# Patient Record
Sex: Male | Born: 1970 | Race: White | Hispanic: No | Marital: Married | Smoking: Current some day smoker
Health system: Southern US, Community
[De-identification: ages and names within clinical notes are randomized; demographics above are authoritative.]

## PROBLEM LIST (undated history)

## (undated) DIAGNOSIS — I10 Essential (primary) hypertension: Secondary | ICD-10-CM

---

## 2015-02-08 ENCOUNTER — Encounter (HOSPITAL_COMMUNITY): Payer: Self-pay | Admitting: Emergency Medicine

## 2015-02-08 ENCOUNTER — Emergency Department (HOSPITAL_COMMUNITY)

## 2015-02-08 ENCOUNTER — Emergency Department (HOSPITAL_COMMUNITY)
Admission: EM | Admit: 2015-02-08 | Discharge: 2015-02-08 | Disposition: A | Discharge status: Home / Self Care | Attending: Emergency Medicine | Admitting: Emergency Medicine

## 2015-02-08 DIAGNOSIS — Y9289 Other specified places as the place of occurrence of the external cause: Secondary | ICD-10-CM | POA: Insufficient documentation

## 2015-02-08 DIAGNOSIS — Y9389 Activity, other specified: Secondary | ICD-10-CM | POA: Insufficient documentation

## 2015-02-08 DIAGNOSIS — Y998 Other external cause status: Secondary | ICD-10-CM | POA: Diagnosis not present

## 2015-02-08 DIAGNOSIS — S61012A Laceration without foreign body of left thumb without damage to nail, initial encounter: Secondary | ICD-10-CM | POA: Insufficient documentation

## 2015-02-08 DIAGNOSIS — Y288XXA Contact with other sharp object, undetermined intent, initial encounter: Secondary | ICD-10-CM | POA: Insufficient documentation

## 2015-02-08 DIAGNOSIS — Z23 Encounter for immunization: Secondary | ICD-10-CM | POA: Diagnosis not present

## 2015-02-08 DIAGNOSIS — S6992XA Unspecified injury of left wrist, hand and finger(s), initial encounter: Secondary | ICD-10-CM | POA: Diagnosis present

## 2015-02-08 DIAGNOSIS — S61219A Laceration without foreign body of unspecified finger without damage to nail, initial encounter: Secondary | ICD-10-CM

## 2015-02-08 HISTORY — DX: Essential (primary) hypertension: I10

## 2015-02-08 MED ORDER — TETANUS-DIPHTH-ACELL PERTUSSIS 5-2.5-18.5 LF-MCG/0.5 IM SUSP
0.5000 mL | Freq: Once | INTRAMUSCULAR | Status: AC
  Administered 2015-02-08: 0.5 mL via INTRAMUSCULAR
  Filled 2015-02-08: qty 0.5

## 2015-02-08 MED ORDER — GELATIN ABSORBABLE 12-7 MM EX MISC
1.0000 | Freq: Once | CUTANEOUS | Status: AC
  Administered 2015-02-08: 1 via TOPICAL
  Filled 2015-02-08 (×2): qty 1

## 2015-02-08 MED ORDER — OXYCODONE-ACETAMINOPHEN 5-325 MG PO TABS
2.0000 | ORAL_TABLET | Freq: Once | ORAL | Status: AC
  Administered 2015-02-08: 2 via ORAL
  Filled 2015-02-08: qty 2

## 2015-02-08 MED ORDER — CEPHALEXIN 500 MG PO CAPS: 500.0000 mg | ORAL_CAPSULE | Freq: Four times a day (QID) | ORAL | Status: AC

## 2015-02-08 MED ORDER — HYDROCODONE-ACETAMINOPHEN 5-325 MG PO TABS: 2.0000 | ORAL_TABLET | ORAL | Status: AC | PRN

## 2015-02-08 NOTE — ED Notes (Signed)
Pt accidentally cut off the end of his thumb and his thumb nail with box cutters. Not up to date with tetanus. Moderate-heavy bleeding.

## 2015-02-08 NOTE — ED Notes (Addendum)
Gelfoam and finger splint applied. Pt instructed to call pcp and get appt with hand surgeon in [place], where he is from. Pt told for s/s to watch for and what the hand surgeon needs to be checking for. Pt instructed that if he is unable to get an app[facility] with [facility], then to follow up with Dr Izora Ribasoley in [place].   Pt escorted to discharge window. Pt verbalized understanding discharge instructions. In no acute distress.

## 2015-02-08 NOTE — Discharge Instructions (Signed)
Traumatic Finger Amputation °A traumatic finger amputation is when you lose part or all of a finger because of an accident or injury. The severity of this type of injury can vary widely. It can mean that just the tip of your finger gets ripped off (avulsion), or it can mean that you completely lose a finger (amputation). °Traumatic finger amputation is a medical emergency. It requires immediate care to prevent further damage and to save the finger. °CAUSES °A traumatic finger amputation usually results from an accident that involves: °· A car. °· Power tools. °· Factory work. °· Farm or lawn equipment. °SYMPTOMS  °Symptoms of a traumatic finger amputation include: °· Bleeding. °· Pain. °· Damage to surrounding tissues, such as bones, muscles, tendons, and skin. °Severe injuries can sometimes lead to shock, which is a life-threatening condition in which your body fails to get blood, oxygen, and nutrients to vital organs. Some common symptoms of shock include low blood pressure, sweating, weakness, and pale skin. °DIAGNOSIS °Your health care provider will do a physical exam and ask for details about how your injury occurred. The physical exam will help your health care provider to determine the severity of the injury and the best way to repair it. X-rays may be done to check for damage to the surrounding bones and tissues. °TREATMENT °Treatment depends on the type of injury that you have and how bad it is. °· Your health care provider will clean the wound thoroughly and apply a medicine (anesthetic) to relieve pain. °· If just the tip of your finger was removed, the wound will typically heal on its own with a protective bandage (dressing) and regular cleaning. °· For more severe injuries, a portion of skin may need to be taken from another part of the body (graft) and attached to the wound site until the wound heals. °· If a large portion of the finger was amputated, it may be possible to reattach it surgically  (replantation). °HOME CARE INSTRUCTIONS °· Take medicines only as directed by your health care provider. °· If you were prescribed an antibiotic medicine, finish all of it even if you start to feel better. °· There are many different ways to close and cover a wound, including stitches (sutures), skin glue, and adhesive strips. Follow your health care provider's instructions about: °¨ Wound care. °¨ Dressing changes and removal. °¨ Wound closure removal. °· Check your wound every day for signs of infection. Watch for: °¨ Redness, swelling, or pain. °¨ Fluid, blood, or pus. °· Do exercises to strengthen your finger and hand as directed by your health care provider. °SEEK MEDICAL CARE IF: °· Your wound does not seem to be healing well. °· You have redness, swelling, or pain at your wound site. °· You have fluid, blood, or pus coming from your wound. °· You have a fever. °  °This information is not intended to replace advice given to you by your health care provider. Make sure you discuss any questions you have with your health care provider. °  °Document Released: 06/14/2001 Document Revised: 04/26/2014 Document Reviewed: 12/19/2013 °Elsevier Interactive Patient Education ©2016 [employer] ° °

## 2015-02-08 NOTE — ED Notes (Signed)
Pt alert and oriented x4. Respirations even and unlabored, bilateral symmetrical rise and fall of chest. Skin warm and dry. In no acute distress. Denies needs.   

## 2015-02-08 NOTE — ED Notes (Signed)
Ortho called 

## 2015-02-08 NOTE — ED Provider Notes (Signed)
CSN: [id number]     Arrival date & time 02/08/15  1332 History  By signing my name below, I, Placido Sou, attest that this documentation has been prepared under the direction and in the presence of Cheron Schaumann, [place]. Electronically Signed: Placido Sou, ED Scribe. 02/08/2015. 1:46 PM.  Pt cut his finger with a box cutter.  Pt complains of cutting off the tip of his left thumb.  Chief Complaint  Patient presents with  . Finger Injury   Patient is a 44 y.o. male presenting with hand pain. The history is provided by the patient. No language interpreter was used.  Hand Pain This is a new problem. The current episode started today. The problem occurs constantly. The problem has been unchanged. Pertinent negatives include no numbness. Nothing aggravates the symptoms. He has tried nothing for the symptoms. The treatment provided no relief.    HPI Comments: Wyatt Mills is a 44 y.o. male who presents to the Emergency Department complaining of finger laceration   No past medical history on file. No past surgical history on file. No family history on file. Social History  Substance Use Topics  . Smoking status: Not on file  . Smokeless tobacco: Not on file  . Alcohol Use: Not on file    Review of Systems  Neurological: Negative for numbness.  All other systems reviewed and are negative.   Allergies  Review of patient's allergies indicates not on file.  Home Medications   Prior to Admission medications   Not on File   BP 138/94 mmHg  Pulse 92  Temp(Src) 98 F (36.7 C) (Oral)  Resp 17  SpO2 94% Physical Exam  Constitutional: He is oriented to person, place, and time. He appears well-developed and well-nourished.  HENT:  Head: Normocephalic and atraumatic.  Mouth/Throat: No oropharyngeal exudate.  Neck: Normal range of motion. No tracheal deviation present.  Cardiovascular: Normal rate.   Pulmonary/Chest: Effort normal. No respiratory distress.  Abdominal: Soft. There  is no tenderness.  Musculoskeletal: Normal range of motion.  Tip of finger avulsed, (pt has superficial piece of skin) nv and ns intact  Neurological: He is alert and oriented to person, place, and time.  Skin: Skin is warm and dry. He is not diaphoretic.  Psychiatric: He has a normal mood and affect. His behavior is normal.  Nursing note and vitals reviewed.   ED Course  Procedures  DIAGNOSTIC STUDIES: Oxygen Saturation is 94% on RA, adequate by my interpretation.    COORDINATION OF CARE: 1:46 PM Pt presents today due to finger laceration. Discussed treatment plan with pt at bedside . Return precautions noted. Referral to  provided for use as needed. Pt agreed to plan.  Labs Review Labs Reviewed - No data to display  Imaging Review Dg Finger Thumb Left  02/08/2015  CLINICAL DATA:  Accidental left thumb laceration with box cutters. EXAM: LEFT THUMB 2+V COMPARISON:  None. FINDINGS: There is no evidence of fracture or dislocation. There is no evidence of arthropathy or other focal bone abnormality. Irregularity is seen involving the distal soft tissues consistent with laceration or soft tissue amputation. IMPRESSION: No fracture or dislocation is noted. No radiopaque foreign body is noted. Electronically Signed   By: Lupita Raider, M.D.   On: 02/08/2015 14:55   I have personally reviewed and evaluated these images and lab results as part of my medical decision-making.   EKG Interpretation None      MDM gelfoam  Dressing, splint    Final  diagnoses:  Laceration of finger, initial encounter    I spoke to Dr. Izora Ribascoley who advised wound care, keflex and office follow up.   [place] in [place] probably [place] there.  He will see Dr. Izora Ribascoley if he is uanable to [place] hand.   Lonia SkinnerLeslie K BellemeadeSofia, PA-C 02/08/15 2010  Richardean Canalavid H Yao, MD 02/09/15 [id number]

## 2016-04-08 IMAGING — CR DG FINGER THUMB 2+V*L*
3 series · 3 of 3 positions shown · non-contrast
Comparison: None.

CLINICAL DATA: Accidental left thumb laceration with box cutters.

EXAM:
LEFT THUMB 2+V

[x finger obl left]
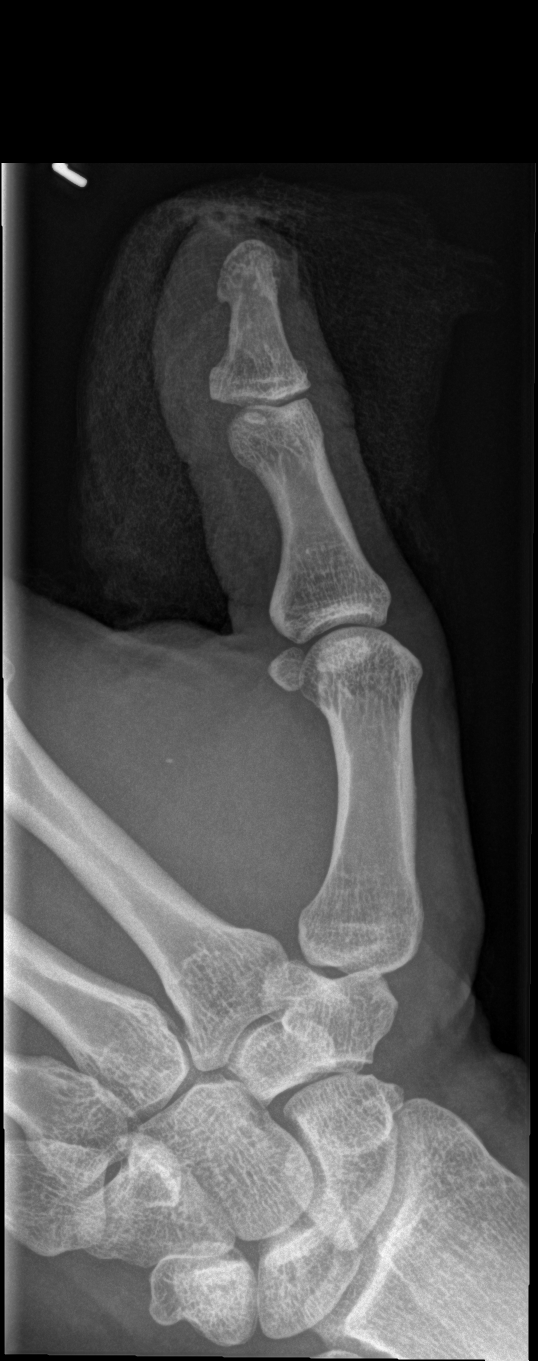

[x finger lat left]
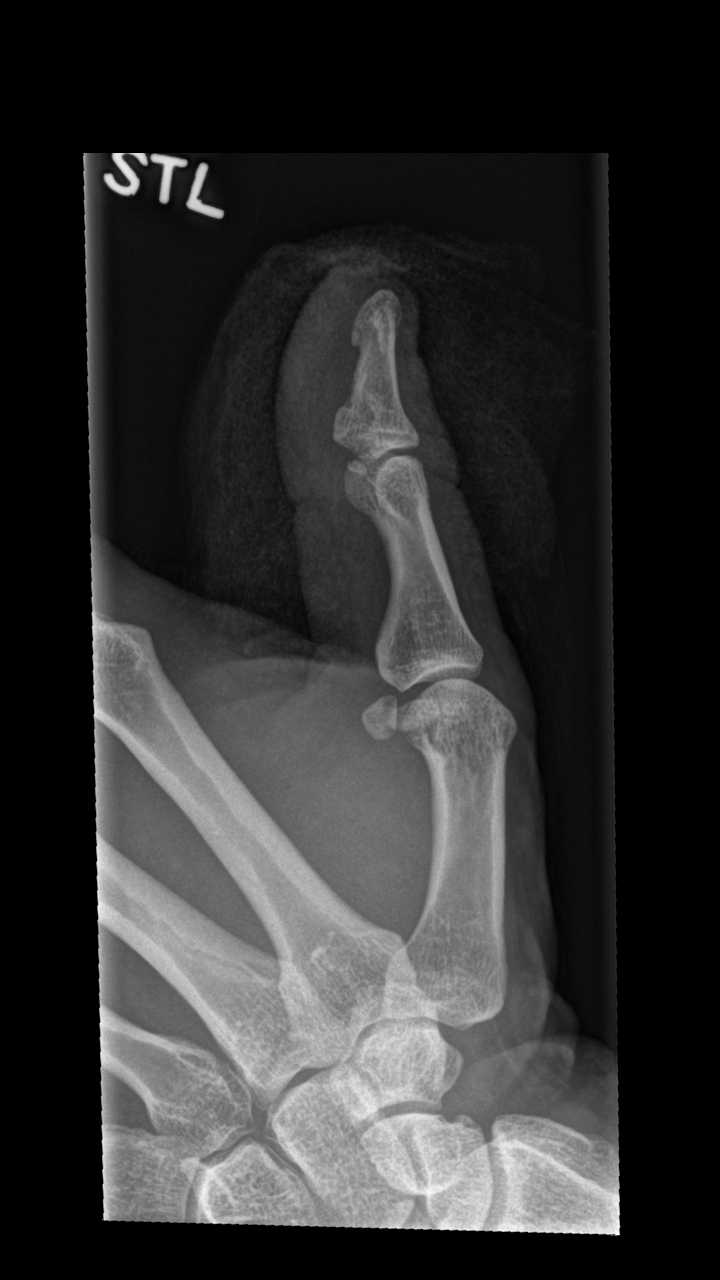

[x finger pa left]
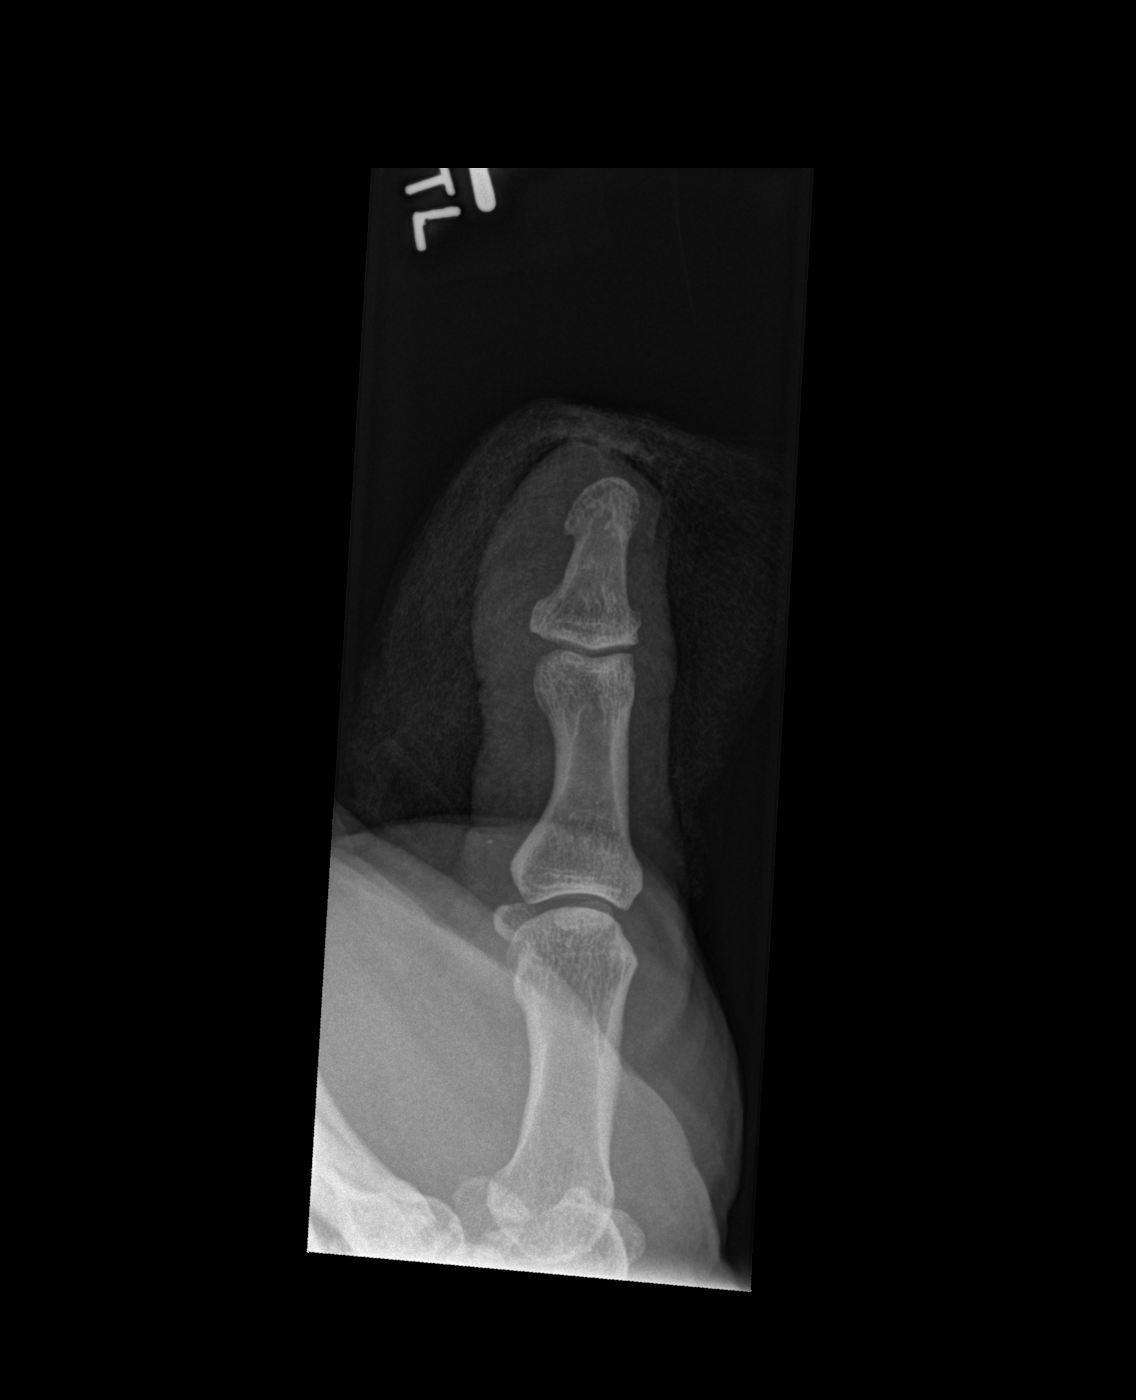

[3 of 3 positions shown; findings below may reference images not displayed]

FINDINGS: There is no evidence of fracture or dislocation. There is no
evidence of arthropathy or other focal bone abnormality.
Irregularity is seen involving the distal soft tissues consistent
with laceration or soft tissue amputation.
IMPRESSION: No fracture or dislocation is noted. No radiopaque foreign body is
noted.
